# Patient Record
Sex: Female | Born: 1965 | Race: White | Hispanic: No | Marital: Single
Health system: Southern US, Community
[De-identification: ages and names within clinical notes are randomized; demographics above are authoritative.]

## PROBLEM LIST (undated history)

## (undated) HISTORY — PX: AUGMENTATION MAMMAPLASTY: SUR837

## (undated) HISTORY — PX: ABDOMINAL HYSTERECTOMY: SHX81

---

## 2004-11-11 ENCOUNTER — Ambulatory Visit: Payer: Self-pay | Admitting: Gastroenterology

## 2006-03-26 ENCOUNTER — Ambulatory Visit: Payer: Self-pay | Admitting: Gastroenterology

## 2006-04-02 ENCOUNTER — Ambulatory Visit: Payer: Self-pay | Admitting: Gastroenterology

## 2006-04-18 ENCOUNTER — Ambulatory Visit: Payer: Self-pay | Admitting: Gastroenterology

## 2007-09-02 ENCOUNTER — Ambulatory Visit: Payer: Self-pay | Admitting: Family Medicine

## 2008-11-03 ENCOUNTER — Ambulatory Visit: Payer: Self-pay | Admitting: Internal Medicine

## 2009-01-07 ENCOUNTER — Ambulatory Visit: Payer: Self-pay | Admitting: Nurse Practitioner

## 2009-04-07 ENCOUNTER — Ambulatory Visit: Payer: Self-pay | Admitting: Internal Medicine

## 2009-04-13 ENCOUNTER — Ambulatory Visit: Payer: Self-pay | Admitting: Gastroenterology

## 2009-04-21 ENCOUNTER — Ambulatory Visit: Payer: Self-pay | Admitting: Internal Medicine

## 2009-07-21 ENCOUNTER — Ambulatory Visit: Payer: Self-pay | Admitting: Internal Medicine

## 2009-08-11 ENCOUNTER — Ambulatory Visit: Payer: Self-pay | Admitting: Internal Medicine

## 2009-08-21 ENCOUNTER — Ambulatory Visit: Payer: Self-pay | Admitting: Internal Medicine

## 2009-12-04 ENCOUNTER — Ambulatory Visit: Payer: Self-pay | Admitting: Internal Medicine

## 2010-01-13 ENCOUNTER — Ambulatory Visit: Payer: Self-pay

## 2010-01-24 ENCOUNTER — Other Ambulatory Visit: Payer: Self-pay | Admitting: Physician Assistant

## 2010-02-02 ENCOUNTER — Ambulatory Visit: Payer: Self-pay | Admitting: Gastroenterology

## 2010-03-15 ENCOUNTER — Emergency Department: Payer: Self-pay | Admitting: Emergency Medicine

## 2010-05-01 ENCOUNTER — Ambulatory Visit: Payer: Self-pay | Admitting: Family Medicine

## 2011-03-30 ENCOUNTER — Ambulatory Visit: Payer: Self-pay

## 2012-01-09 ENCOUNTER — Ambulatory Visit: Payer: Self-pay | Admitting: Internal Medicine

## 2012-08-08 ENCOUNTER — Ambulatory Visit: Payer: Self-pay

## 2012-09-23 ENCOUNTER — Observation Stay: Payer: Self-pay | Admitting: Internal Medicine

## 2012-09-23 LAB — CBC
HCT: 40.1 % (ref 35.0–47.0)
MCHC: 34 g/dL (ref 32.0–36.0)
MCV: 94 fL (ref 80–100)
Platelet: 212 10*3/uL (ref 150–440)
RBC: 4.25 10*6/uL (ref 3.80–5.20)
WBC: 8.9 10*3/uL (ref 3.6–11.0)

## 2012-09-23 LAB — URINALYSIS, COMPLETE
Bilirubin,UR: NEGATIVE
Glucose,UR: NEGATIVE mg/dL (ref 0–75)
Hyaline Cast: 3
Leukocyte Esterase: NEGATIVE
Leukocyte Esterase: NEGATIVE
Nitrite: NEGATIVE
Ph: 6 (ref 4.5–8.0)
Ph: 5 (ref 4.5–8.0)
Protein: NEGATIVE
Protein: NEGATIVE
RBC,UR: 3 /HPF (ref 0–5)
RBC,UR: 1 /HPF (ref 0–5)
Specific Gravity: 1.025 (ref 1.003–1.030)

## 2012-09-23 LAB — COMPREHENSIVE METABOLIC PANEL
Albumin: 4.2 g/dL (ref 3.4–5.0)
BUN: 19 mg/dL — ABNORMAL HIGH (ref 7–18)
Co2: 25 mmol/L (ref 21–32)
Creatinine: 0.76 mg/dL (ref 0.60–1.30)
EGFR (African American): >60
Glucose: 86 mg/dL (ref 65–99)
Osmolality: 283 (ref 275–301)
SGOT(AST): 20 U/L (ref 15–37)
Sodium: 141 mmol/L (ref 136–145)

## 2012-09-23 LAB — LIPASE, BLOOD: Lipase: 125 U/L (ref 73–393)

## 2012-09-24 LAB — COMPREHENSIVE METABOLIC PANEL
Albumin: 3 g/dL — ABNORMAL LOW (ref 3.4–5.0)
Anion Gap: 9 (ref 7–16)
BUN: 14 mg/dL (ref 7–18)
Calcium, Total: 7.3 mg/dL — ABNORMAL LOW (ref 8.5–10.1)
Co2: 21 mmol/L (ref 21–32)
Creatinine: 0.65 mg/dL (ref 0.60–1.30)
EGFR (African American): >60
EGFR (Non-African Amer.): >60
SGOT(AST): 13 U/L — ABNORMAL LOW (ref 15–37)
Sodium: 146 mmol/L — ABNORMAL HIGH (ref 136–145)
Total Protein: 5.5 g/dL — ABNORMAL LOW (ref 6.4–8.2)

## 2012-09-24 LAB — CBC WITH DIFFERENTIAL/PLATELET
Basophil #: 0 10*3/uL (ref 0.0–0.1)
Basophil %: 0.4 %
Eosinophil #: 0 10*3/uL (ref 0.0–0.7)
Lymphocyte #: 2.6 10*3/uL (ref 1.0–3.6)
Lymphocyte %: 32.5 %
MCHC: 33.4 g/dL (ref 32.0–36.0)
MCV: 94 fL (ref 80–100)
Monocyte #: 0.7 x10 3/mm (ref 0.2–0.9)
Monocyte %: 8 %
Neutrophil #: 4.8 10*3/uL (ref 1.4–6.5)
Platelet: 151 10*3/uL (ref 150–440)
RBC: 3.31 10*6/uL — ABNORMAL LOW (ref 3.80–5.20)
WBC: 8.1 10*3/uL (ref 3.6–11.0)

## 2012-09-25 LAB — BASIC METABOLIC PANEL
BUN: 5 mg/dL — ABNORMAL LOW (ref 7–18)
Calcium, Total: 7.1 mg/dL — ABNORMAL LOW (ref 8.5–10.1)
Chloride: 123 mmol/L — ABNORMAL HIGH (ref 98–107)
Co2: 21 mmol/L (ref 21–32)
Creatinine: 0.55 mg/dL — ABNORMAL LOW (ref 0.60–1.30)
EGFR (Non-African Amer.): >60
Glucose: 93 mg/dL (ref 65–99)
Osmolality: 295 (ref 275–301)
Potassium: 3.4 mmol/L — ABNORMAL LOW (ref 3.5–5.1)
Sodium: 150 mmol/L — ABNORMAL HIGH (ref 136–145)

## 2012-09-25 LAB — CBC WITH DIFFERENTIAL/PLATELET
Basophil %: 0.8 %
Eosinophil #: 0.1 10*3/uL (ref 0.0–0.7)
HCT: 33.5 % — ABNORMAL LOW (ref 35.0–47.0)
HGB: 10.9 g/dL — ABNORMAL LOW (ref 12.0–16.0)
Lymphocyte #: 2.1 10*3/uL (ref 1.0–3.6)
Lymphocyte %: 39.9 %
MCH: 31.1 pg (ref 26.0–34.0)
MCHC: 32.6 g/dL (ref 32.0–36.0)
Monocyte #: 0.5 x10 3/mm (ref 0.2–0.9)
Monocyte %: 8.9 %
Neutrophil #: 2.6 10*3/uL (ref 1.4–6.5)
Neutrophil %: 49.2 %
Platelet: 162 10*3/uL (ref 150–440)
RBC: 3.51 10*6/uL — ABNORMAL LOW (ref 3.80–5.20)
RDW: 12.3 % (ref 11.5–14.5)
WBC: 5.4 10*3/uL (ref 3.6–11.0)

## 2012-09-25 LAB — URINE CULTURE

## 2012-09-26 LAB — CBC WITH DIFFERENTIAL/PLATELET
Basophil #: 0.1 10*3/uL (ref 0.0–0.1)
Basophil %: 0.7 %
Eosinophil %: 0.7 %
HCT: 33.4 % — ABNORMAL LOW (ref 35.0–47.0)
Lymphocyte #: 2.8 10*3/uL (ref 1.0–3.6)
Monocyte #: 0.5 x10 3/mm (ref 0.2–0.9)
Neutrophil #: 4.1 10*3/uL (ref 1.4–6.5)
Neutrophil %: 55.1 %
Platelet: 167 10*3/uL (ref 150–440)
WBC: 7.4 10*3/uL (ref 3.6–11.0)

## 2012-09-27 LAB — PATHOLOGY REPORT

## 2012-10-24 ENCOUNTER — Emergency Department: Payer: Self-pay | Admitting: Internal Medicine

## 2012-10-24 LAB — URINALYSIS, COMPLETE
Bilirubin,UR: NEGATIVE
Glucose,UR: NEGATIVE mg/dL (ref 0–75)
Leukocyte Esterase: NEGATIVE
Ph: 5 (ref 4.5–8.0)
Protein: NEGATIVE
RBC,UR: 6 /HPF (ref 0–5)
Specific Gravity: 1.016 (ref 1.003–1.030)
WBC UR: 1 /HPF (ref 0–5)

## 2012-10-24 LAB — COMPREHENSIVE METABOLIC PANEL
Albumin: 4.3 g/dL (ref 3.4–5.0)
Alkaline Phosphatase: 72 U/L (ref 50–136)
BUN: 9 mg/dL (ref 7–18)
Bilirubin,Total: 0.2 mg/dL (ref 0.2–1.0)
Chloride: 112 mmol/L — ABNORMAL HIGH (ref 98–107)
Co2: 24 mmol/L (ref 21–32)
Creatinine: 0.72 mg/dL (ref 0.60–1.30)
EGFR (African American): >60
EGFR (Non-African Amer.): >60
Glucose: 105 mg/dL — ABNORMAL HIGH (ref 65–99)
Potassium: 3.8 mmol/L (ref 3.5–5.1)
SGOT(AST): 16 U/L (ref 15–37)
SGPT (ALT): 14 U/L (ref 12–78)
Sodium: 143 mmol/L (ref 136–145)

## 2012-10-24 LAB — TSH: Thyroid Stimulating Horm: 0.75 u[IU]/mL

## 2012-10-24 LAB — DRUG SCREEN, URINE
Barbiturates, Ur Screen: NEGATIVE (ref ?–200)
Benzodiazepine, Ur Scrn: POSITIVE (ref ?–200)
Cannabinoid 50 Ng, Ur ~~LOC~~: NEGATIVE (ref ?–50)
Cocaine Metabolite,Ur ~~LOC~~: NEGATIVE (ref ?–300)
Tricyclic, Ur Screen: NEGATIVE (ref ?–1000)

## 2012-10-24 LAB — LIPASE, BLOOD: Lipase: 89 U/L (ref 73–393)

## 2012-10-24 LAB — HEPATIC FUNCTION PANEL A (ARMC): Bilirubin, Direct: <0.05 mg/dL (ref 0.00–0.20)

## 2013-05-29 ENCOUNTER — Emergency Department: Payer: Self-pay | Admitting: Emergency Medicine

## 2013-05-29 LAB — COMPREHENSIVE METABOLIC PANEL
Alkaline Phosphatase: 82 U/L (ref 50–136)
Anion Gap: 7 (ref 7–16)
BUN: 18 mg/dL (ref 7–18)
Calcium, Total: 9.7 mg/dL (ref 8.5–10.1)
Chloride: 109 mmol/L — ABNORMAL HIGH (ref 98–107)
Co2: 24 mmol/L (ref 21–32)
Creatinine: 0.78 mg/dL (ref 0.60–1.30)
EGFR (Non-African Amer.): >60
Glucose: 102 mg/dL — ABNORMAL HIGH (ref 65–99)
Osmolality: 281 (ref 275–301)
Potassium: 3.8 mmol/L (ref 3.5–5.1)
SGPT (ALT): 16 U/L (ref 12–78)
Sodium: 140 mmol/L (ref 136–145)
Total Protein: 7.1 g/dL (ref 6.4–8.2)

## 2013-05-29 LAB — LIPASE, BLOOD: Lipase: 123 U/L (ref 73–393)

## 2013-05-29 LAB — CBC
HCT: 37.3 % (ref 35.0–47.0)
HGB: 13.2 g/dL (ref 12.0–16.0)
MCHC: 35.3 g/dL (ref 32.0–36.0)
MCV: 91 fL (ref 80–100)
RBC: 4.11 10*6/uL (ref 3.80–5.20)

## 2014-12-11 NOTE — Consult Note (Signed)
Chief Complaint:   Subjective/Chief Complaint Patient with c/o intense uper epigstric pain after egd.  EGD uneventful.  Patient states pain is similar to that she gets occasionally.  Will give scheduled meds and obtain 3 way abd films.   Electronic Signatures: Barnetta ChapelSkulskie, Martin (MD)  (Signed 05-Feb-14 15:59)  Authored: Chief Complaint   Last Updated: 05-Feb-14 15:59 by Barnetta ChapelSkulskie, Martin (MD)

## 2014-12-11 NOTE — Consult Note (Signed)
Chief Complaint:   Subjective/Chief Complaint seen for n/v abdominalpain.  Mild nausea today, much less abdominalpain, no emesis.   VITAL SIGNS/ANCILLARY NOTES: **Vital Signs.:   05-Feb-14 06:20   Vital Signs Type Routine   Temperature Temperature (F) 98.1   Celsius 36.7   Temperature Source Oral   Pulse Pulse 93   Respirations Respirations 18   Systolic BP Systolic BP 111   Diastolic BP (mmHg) Diastolic BP (mmHg) 76   Mean BP 87   Pulse Ox % Pulse Ox % 100   Pulse Ox Activity Level  At rest   Oxygen Delivery Room Air/ 21 %   Brief Assessment:   Cardiac Regular    Respiratory clear BS    Gastrointestinal details normal Soft  Nondistended  No masses palpable  Bowel sounds normal  mild epigastric discomfort to palpation   Lab Results: Routine Chem:  05-Feb-14 06:26    Glucose, Serum 93   BUN  5   Creatinine (comp)  0.55   Sodium, Serum  150   Potassium, Serum  3.4   Chloride, Serum  123   CO2, Serum 21   Calcium (Total), Serum  7.1   Anion Gap  6   Osmolality (calc) 295   eGFR (African American) >60   eGFR (Non-African American) >60 (eGFR values <24mL/min/1.73 m2 may be an indication of chronic kidney disease (CKD). Calculated eGFR is useful in patients with stable renal function. The eGFR calculation will not be reliable in acutely ill patients when serum creatinine is changing rapidly. It is not useful in  patients on dialysis. The eGFR calculation may not be applicable to patients at the low and high extremes of body sizes, pregnant women, and vegetarians.)   Magnesium, Serum 2.0 (1.8-2.4 THERAPEUTIC RANGE: 4-7 mg/dL TOXIC: > 10 mg/dL  -----------------------)  Routine Hem:  05-Feb-14 06:26    WBC (CBC) 5.4   RBC (CBC)  3.51   Hemoglobin (CBC)  10.9   Hematocrit (CBC)  33.5   Platelet Count (CBC) 162   MCV 96   MCH 31.1   MCHC 32.6   RDW 12.3   Neutrophil % 49.2   Lymphocyte % 39.9   Monocyte % 8.9   Eosinophil % 1.2   Basophil % 0.8   Neutrophil  # 2.6   Lymphocyte # 2.1   Monocyte # 0.5   Eosinophil # 0.1   Basophil # 0.0 (Result(s) reported on 25 Sep 2012 at 07:03AM.)   Assessment/Plan:  Assessment/Plan:   Assessment 1) n.v abdominal pain in the setting of h/o recurrent erosive gastritis.  improved symptoms on iv ppi.    Plan 1) egd today.  I have discussed the risks benefits and complicatonsof egd to include not limited to bleeding infectionp perforation and sedation and she wishes to proceed.   Electronic Signatures: Loistine Simas (MD)  (Signed 05-Feb-14 13:24)  Authored: Chief Complaint, VITAL SIGNS/ANCILLARY NOTES, Brief Assessment, Lab Results, Assessment/Plan   Last Updated: 05-Feb-14 13:24 by Loistine Simas (MD)
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
## 2014-12-11 NOTE — H&P (Signed)
PATIENT NAME:  Alexandra Lucero, Alexandra Lucero MR#:  [id number] DATE OF BIRTH:  05/03/1966  DATE OF ADMISSION:  09/23/2012  PRIMARY CARE PHYSICIAN: Nonlocal  REFERRING PHYSICIAN: Dr. Janalyn Harder   CHIEF COMPLAINT: Epigastric pain, nausea and vomiting.   HISTORY OF PRESENT ILLNESS: The patient is a very nice 49 year old female who has a history of migraines, peptic ulcer disease and GERD. The patient has been in her normal stage of health but today comes with a history of severe epigastric pain, nausea and vomiting since 3:00 a.m. last night. The pain started on the epigastric area, woke her up in the middle of the morning at 3:00 a.m. Apparently, she usually has episodes like this occasionally a couple of times a month, occasionally weekly, but they relieve after she takes Phenergan. The pain just got worse this morning for which she asked her boyfriend to bring her here to the ER. Apparently, she has vomited about 30 times, and the vomit has become large and right now fecaloid in characteristics. The patient is having a lot of dry heaves. There is no blood in the vomit, just like mucus at the beginning, now it is brown fecaloid-looking. She has not had any diarrhea. She has not had any constipation although she does not go to the bathroom every day. She has normal bowel movements every couple of days, usually.  She does not eat  much. She is very thin.  She has a very high-stress job. She works 6 days a week and doesn't get the right schedules. She has not eaten anything since 5:00 p.m. yesterday, and her pain has improved. She was given a large amount of medications here including diazepam, hydromorphone, metoclopramide, morphine, Zofran, Compazine, promethazine. Nothing has helped the pain, for which we are going to admit. A GI consultation is granted. I am going to obtain a stat KUB because of the change of the vomit to be fecaloid. If there are any changes on the KUB, we are going to consult Surgery.  For now, I  am planning on consulting GI since they have seen her in the past.   REVIEW OF SYSTEMS:   CONSTITUTIONAL: Negative fever, negative fatigue, negative weakness, negative significant weight gain or weight loss. EYES: No blurry vision, double vision. She occasionally has migraines, and she has blurry vision with them; but she has not had a migraine in a while. She has had several headaches in the last couple of days.  ENT: No tinnitus. No ear pain. No postnasal drip. No sinus pain.   RESPIRATORY: No cough. No wheezing. No hemoptysis. No painful respiration. No history of pneumonia.  CARDIOVASCULAR: No chest pain, orthopnea, edema, arrhythmias, palpitations, or syncope.  GASTROINTESTINAL: Positive nausea, positive vomiting, but no diarrhea. Positive epigastric pain as mentioned above. No hematemesis. No melena. No jaundice. No rectal bleeding. No hemorrhoids. No change in bowel habits.  GENITOURINARY: No dysuria or hematuria. No change of frequency or incontinence. No genitourinary problems.  She had a hysterectomy in the past. She has never been pregnant.  ENDOCRINOLOGY: No polyuria, polydipsia, or polyphagia. No cold or heat intolerance.  HEMATOLOGIC/LYMPHATIC: No history of bleeding, easy bruising or swollen glands.  SKIN: No rashes or skin lesions.  MUSCULOSKELETAL: No swelling of joints, no back pain, shoulder pain, knee or hip pain.  NEUROLOGICAL: No numbness, tingling. No ataxia. No CVA. No TIA. No changes in bowel incontinence or urinary incontinence.  PSYCHIATRIC: No anxiety or significant depression.   PAST MEDICAL HISTORY: Positive for migraines. The patient  states she has history of  peptic ulcer disease. She had an upper GI done in 2011 by Dr. Niel HummerIftikhar which showed normal esophagus, nonbleeding erosive gastropathy, normal duodenum. No signs of ulcers that I can tell. GERD.   ALLERGIES: No known drug allergies.   PAST SURGICAL HISTORY: History of bilateral breast implants and a  hysterectomy.   FAMILY HISTORY: Negative for MI or coronary artery disease.  Her dad has diabetes. Her mother had has been healthy. Her brother has Crohn's, and her niece has Crohn's as well.   SOCIAL HISTORY: The patient is a smoker. He smokes a little bit less than a pack a day. She has been smoking for over 20 years. She does not drink. She drinks one cup of coffee in the morning and a lot of Coca-Cola during the day to keep herself awake. She denies any IV drug abuse. 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 of the morning to noon, an hour break, and then from 1 until 7. She has a very high stress job, apparently.   MEDICATIONS: The patient takes promethazine oral syrup, oxycodone 10 mg every 6 hours, Estrace vaginal cream 3 times a day, diazepam 10 mg once a day, Dexilant 60 mg orally once daily.   PHYSICAL EXAMINATION: VITAL SIGNS: Blood pressure 114/72, pulse 75, respiratory rate 20, temperature 98.0, oxygen saturation 99% on room air.  GENERAL: The patient is alert at this moment. She was sleeping but easy to be aroused. She is communicative and  cooperative.  HEENT: Her pupils are equal and reactive. Extraocular movements are intact. Mucosa are overall moist. No oropharyngeal exudates. No oral lesions. Anicteric sclerae. Pink conjunctivae.  NECK: Supple. No JVD. No thyromegaly. No adenopathy. No carotid bruits. No masses. No rigidity.  CARDIOVASCULAR: Regular rate and rhythm. No murmurs, rubs, or gallops are appreciated. No tenderness to palpation to anterior chest. No displacement of PMI.  LUNGS: Clear without any wheezing or crepitus. No use of accessory muscles.  ABDOMEN: Slightly tender on the epigastric area. No rebound. No guarding. Bowel sounds are decreased. McBurney's negative. Murphy's negative.  GENITAL: Negative for external lesions.  EXTREMITIES: No edema, no cyanosis, no clubbing. Pulses +2. Capillary refill is around 3  seconds. No rashes or petechiae on the skin.  NEUROLOGIC: Cranial nerves II through XII are intact.   PSYCHIATRIC: Mood: Very flat affect. The patient is withdrawn due to her vomiting and a dark room. She is cooperative.  MUSCULOSKELETAL: No significant joint effusions or joint abnormalities.  LYMPHATIC: No lymph nodes on neck or supraclavicular areas.   LABORATORY, DIAGNOSTIC AND RADIOLOGICAL DATA:  Glucose 86, BUN 19, creatinine 0.76, sodium 141, potassium 4.1, chloride 110. Hepatic panel was negative. UA: 1 white blood cell, 3 red blood cells, trace ketones. Hemoglobin is 13.6, white count is 8.9, hematocrit 40, platelets 212.   CT scan of the abdomen with IV contrast shows no acute abdominal pathology. No gross abnormality of the intestines, unremarkable gallbladder.  Kidneys are normal. Ultrasound of the abdomen, limited survey: Normal right upper quadrant.   ASSESSMENT AND PLAN: A 49 year old female with history of migraines, previous history of severe gastritis and GERD. She states that she has peptic  ulcer disease although her last EGD did not show any ulcers, just nonerosive gastritis, and it was done by Dr. Niel HummerIftikhar.   1. Intractable nausea and vomiting: The patient had a significant amount of vomiting over 30 times.  Here at the ER, it has been witnessed for many, many times. Her characteristics of the vomit have changed, actually, from being clear to now being fecaloid. There is no blood, and there has not been melena. She has not had a bowel movement in a couple of days, but that is her normal bowel habits I am going to get a stat KUB since her pattern for the bowel movement has changed, now looks fecaloid, just to evaluate for obstruction. If worsening, we are going to put in an NG tube. I am going to write on for Phenergan, Zofran, scopolamine and Compazine. I am going to keep her on IV fluids to keep her well hydrated at 125 an hour, monitor her electrolytes. At this moment, the patient  is going to be admitted for intractable vomiting and intractable pain. Continue Dilaudid as needed. I am going to put her on a PPI every 12 hours, and I am going to obtain a GI consultation. At this moment, have don't have a clear idea why she is having this much vomiting, although this seems to be a problem that has been chronic, not as severe as it is today, but she always has some epigastric pain, nausea and vomiting almost on a weekly basis, and it has to be related to her lifestyle, high-stress job. The possibility of the patient having ulcer right now is high. I am going to ask Dr. Niel Hummer to see if he wants to do an EGD.  I am going to order Helicobacter pylori breath test, if possible.  2. Migraines: The patient has significant migraines. At this moment they are controlled.  3. Gastroesophageal reflux disease: Continue PPI.  4. Tobacco abuse: The patient is a current smoker. We are giving her counseling at this moment, and I asked her to quit smoking, spent 3 minutes on smoking counseling.   CODE STATUS: The patient is a FULL CODE.    TIME SPENT: I spent about 50 minutes with this admission.   ____________________________ Felipa Furnace, MD rsg:cb D: 09/23/2012 14:00:36 ET T: 09/23/2012 15:51:56 ET JOB#: [id number]  cc: Felipa Furnace, MD, <Dictator> Lurline Del, MD Pearletha Furl MD ELECTRONICALLY SIGNED 10/01/2012 13:46

## 2014-12-11 NOTE — Discharge Summary (Signed)
PATIENT NAME:  Alexandra Lucero, Alexandra Lucero MR#:  [id number] DATE OF BIRTH:  06-Jan-1966  DATE OF ADMISSION:  09/23/2012 DATE OF DISCHARGE:  09/26/2012  PRIMARY CARE PHYSICIAN: None local.  CONSULTING PHYSICIAN: Barnetta ChapelMartin Skulskie, MD.  PROCEDURE: EGD.   DISCHARGE DIAGNOSES: 1. Bile gastritis. 2. Nonbleeding erosive gastropathy.  3. Anemia.  4. Gastroesophageal reflux disease. 5. Hypokalemia.   CODE STATUS: FULL CODE.   CONDITION: Stable.   HOME MEDICATIONS: 1. Promethazine 6.25 mg/5 mL oral syrup 20 mL twice a day p.r.n. for nausea.  2. Diazepam 10 mg p.o. at bedtime.  3. Estrace vaginal 0.1 mg/gram vaginal cream one application 3 times a week.  4. Dexilant 60 mg p.o. delayed capsule one cap a day.  5. Protonix 40 mg p.o. daily for 21 days.  6. Carafate 1 gram 4 times a day before meals and at bedtime.   DIET: Regular diet.   ACTIVITY: As tolerated.   FOLLOWUP CARE: Follow up with Dr. Marva PandaSkulskie within 3 weeks. Follow up with PCP within 1 to 2 weeks.   REASON FOR ADMISSION: Epigastric pain, nausea and vomiting.   HOSPITAL COURSE: The patient is a 49 year old Caucasian female with a history of migraines, peptic ulcer disease and GERD who presented to the ED with epigastric pain and intractable nausea and vomiting. For detailed history and physical examination, please refer to the admission note dictated by Dr. Mordecai MaesSanchez. The patient had a CAT scan of abdomen with IV contrast that showed no acute abdominal pathology and kidneys are normal. Ultrasound of abdomen is normal. The patient has been treated with Zofran and morphine for nausea and vomiting and abdominal pain. She has been treated with normal saline IV support. The patient's potassium and magnesium were low. She was treated with supplement. Dr. Marva PandaSkulskie evaluated the patient and did endoscopy yesterday which showed bile gastritis and nonbleeding gastropathy. He suggested the patient get Protonix for 3 weeks and Carafate for 1 month. The  patient has no symptoms, no complaints today. The patient is discharged to home.  I discussed the patient's discharge plan with the patient, Dr. Marva PandaSkulskie, and the case manager.   TIME SPENT: About 36 minutes.  ____________________________ Shaune PollackQing Demiyah Fischbach, MD qc:sb D: 09/26/2012 18:07:51 ET T: 09/27/2012 07:18:00 ET JOB#: [id number]  cc: Shaune PollackQing Mayline Dragon, MD, <Dictator> Shaune PollackQING Sanae Willetts MD ELECTRONICALLY SIGNED 09/27/2012 17:50

## 2014-12-16 ENCOUNTER — Other Ambulatory Visit: Payer: Self-pay

## 2014-12-16 DIAGNOSIS — Z1231 Encounter for screening mammogram for malignant neoplasm of breast: Secondary | ICD-10-CM

## 2015-01-04 ENCOUNTER — Ambulatory Visit: Payer: Self-pay

## 2015-09-08 IMAGING — CT CT ABD-PELV W/ CM
1 of 2 series · 15 of 32 positions shown, 19 images · non-contrast
Comparison: none

REASON FOR EXAM: (1) pain and multiple emesis; (2) pain
COMMENTS:

PROCEDURE:     CT  - CT ABDOMEN / PELVIS  W  - May 29, 2013  [DATE]
RESULT:     CT abdomen pelvis dated 05/29/2013
TECHNIQUE: Helical 3 mm sections were obtained from the lung bases through
the pubic symphysis status post intravenous administration of 75 mL of
Dsovue-000.

[Series 2: 3mm soft tissue · axial · 0.62mm/px · z∈[-817,-424]mm · 15 of 143 slices shown, 19 images]
[im 6/143  soft-tissue]
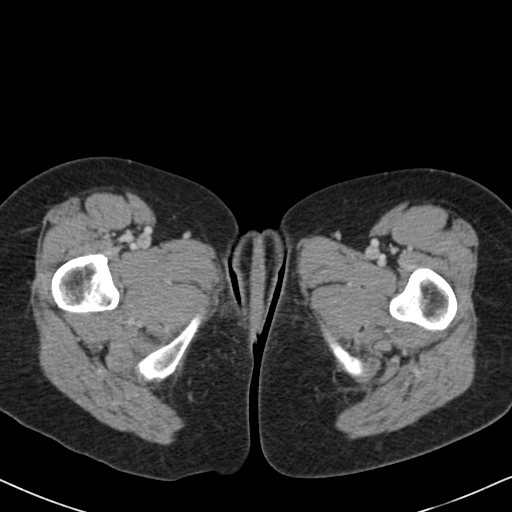
[im 6/143  bone]
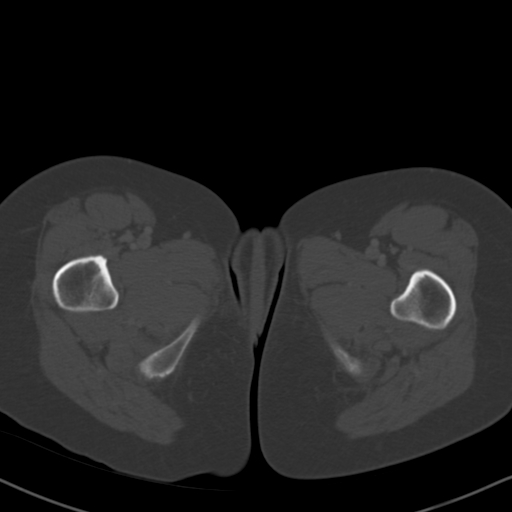
[im 18/143  soft-tissue]
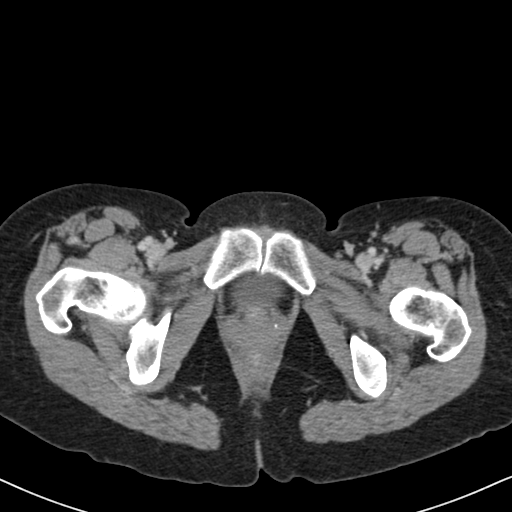
[im 29/143  soft-tissue]
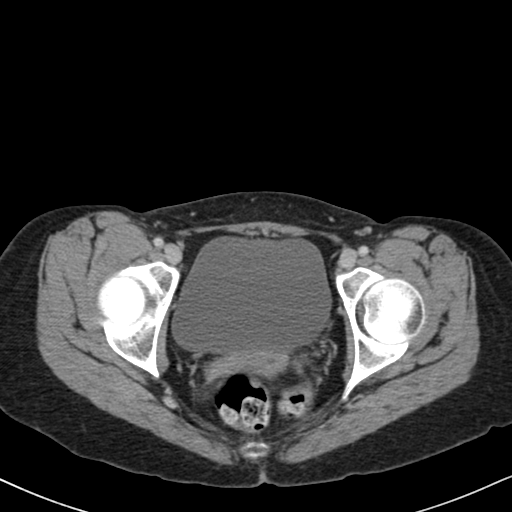
[im 40/143  soft-tissue]
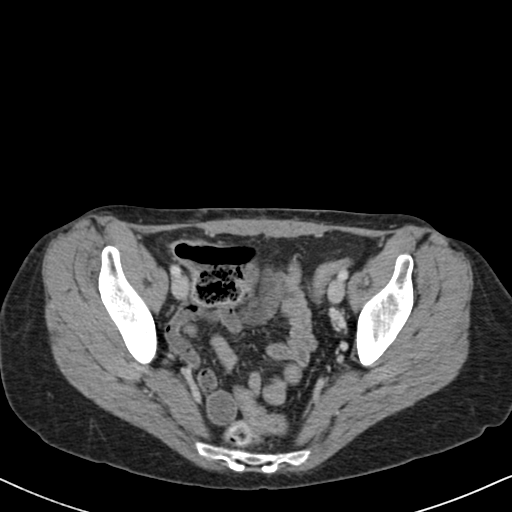
[im 52/143  soft-tissue]
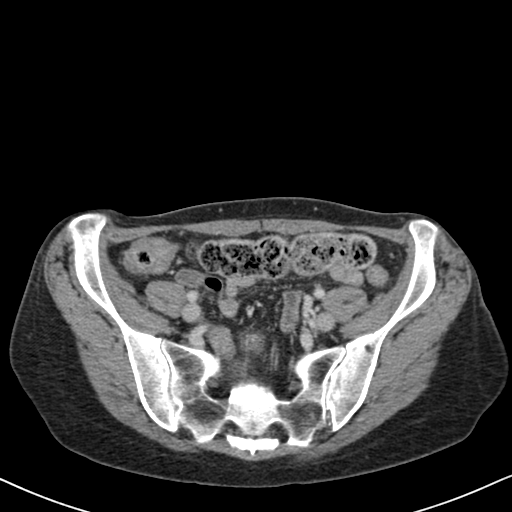
[im 63/143  soft-tissue]
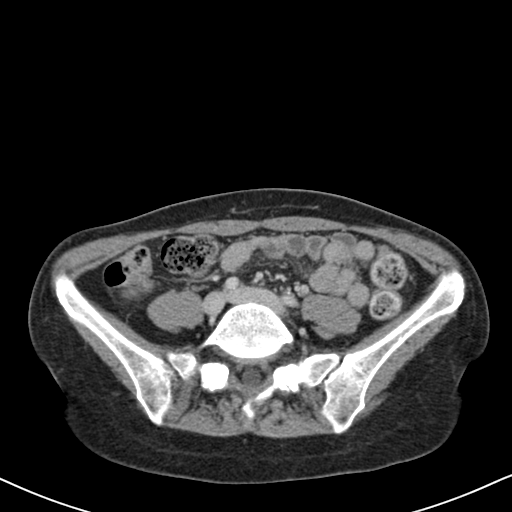
[im 74/143  soft-tissue]
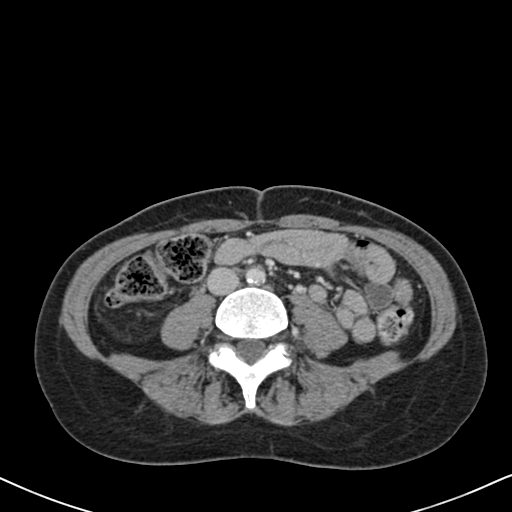
[im 80/143  soft-tissue]
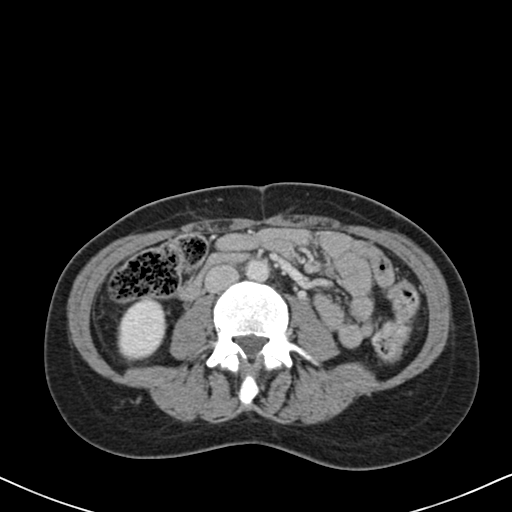
[im 91/143  soft-tissue]
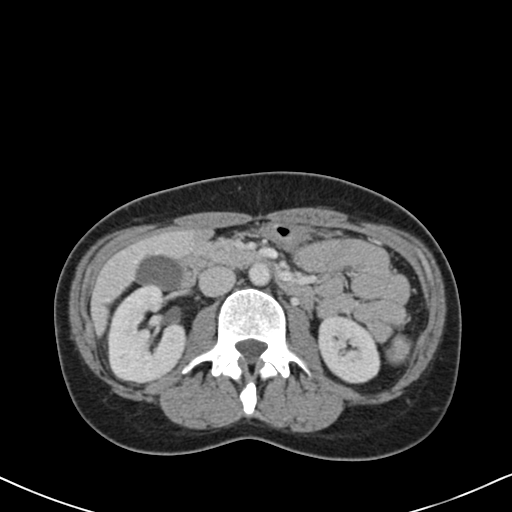
[im 91/143  bone]
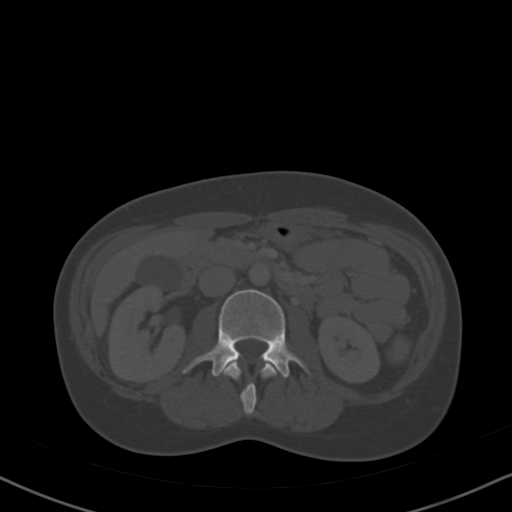
[im 103/143  soft-tissue]
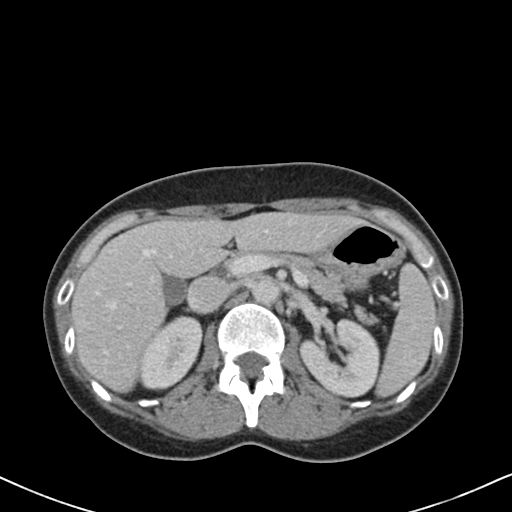
[im 114/143  soft-tissue]
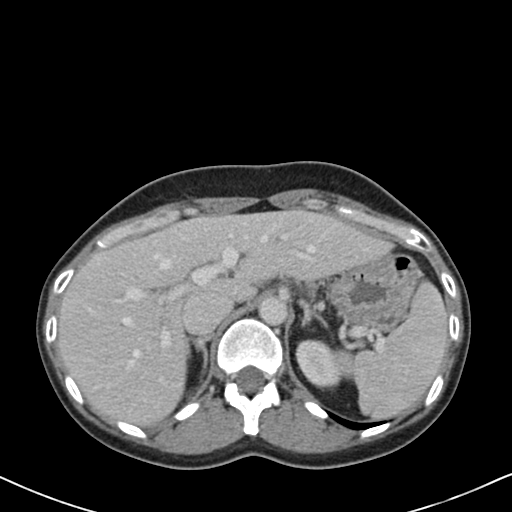
[im 120/143  lung]
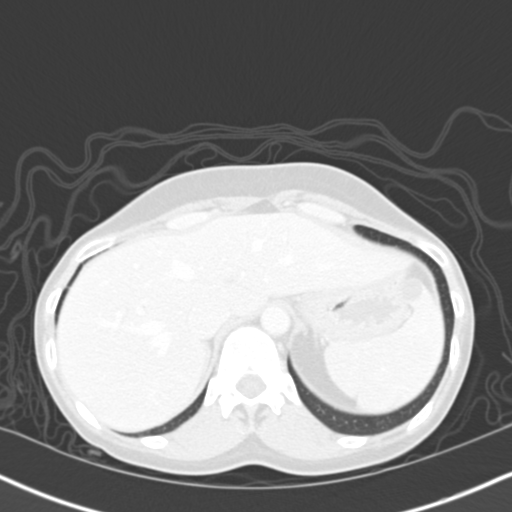
[im 125/143  soft-tissue]
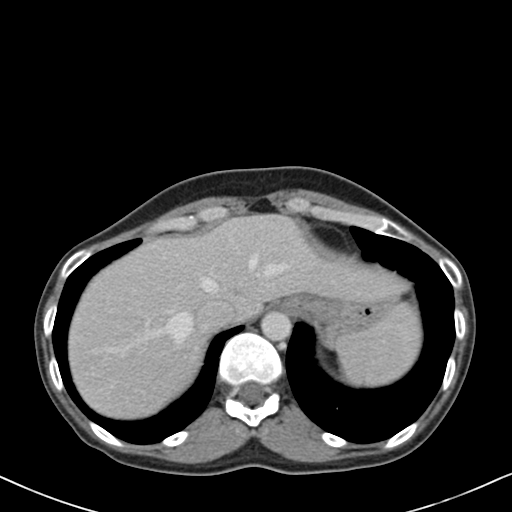
[im 125/143  lung]
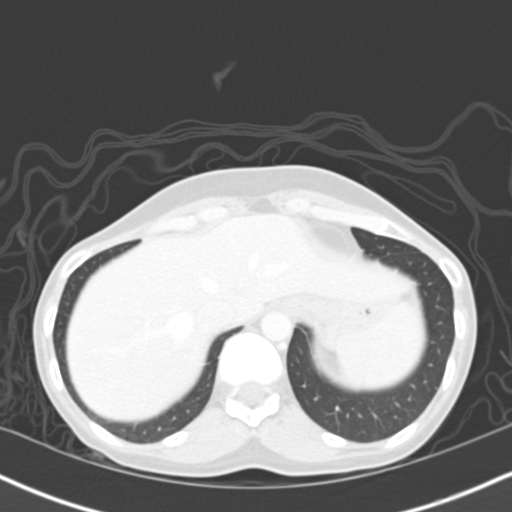
[im 131/143  lung]
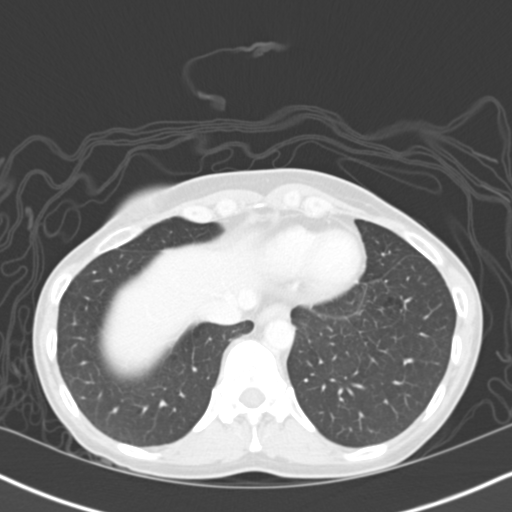
[im 137/143  soft-tissue]
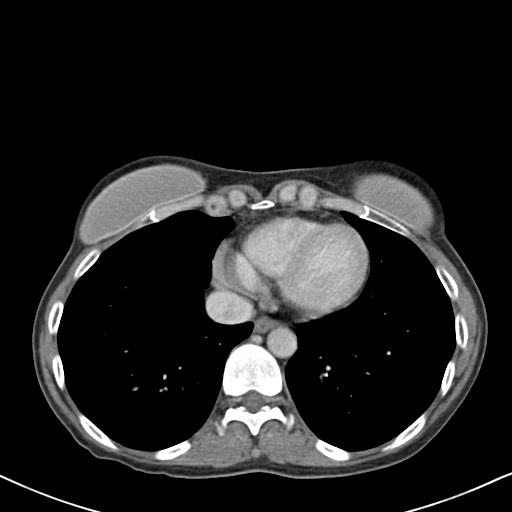
[im 137/143  lung]
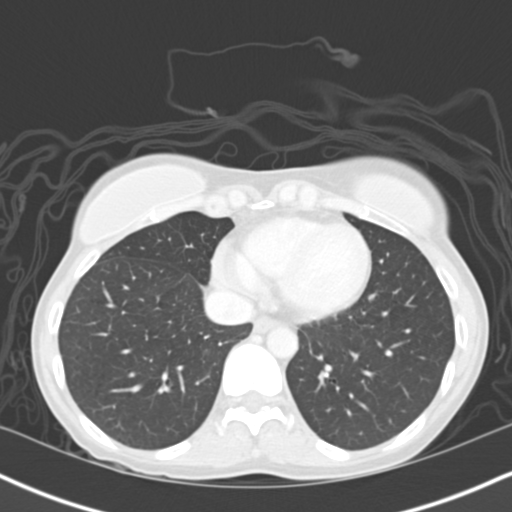

[15 of 32 positions shown; findings below may reference images not displayed]

FINDINGS: The lung bases are unremarkable. The  spleen, adrenals, pancreas,
kidneys are unremarkable. The liver demonstrates a small 8 to 9 mm low
attenuating nodule with a peripheral nodular border in the dome of the right
lobe of the liver. Compared to previous study dated 09/23/2012 this finding is
unchanged like represents a small hemangioma. A small low attenuating focus
projects along the anterior tip of the right lobe of the liver. This finding
is an area of focal fatty infiltration also stable from the previous study.

There is no evidence of bowel obstruction, enteritis, colitis, nor
diverticulitis. The patient status post appendectomy. Moderate amount of
fecal retention is appreciated within the colon. There is no evidence of an
abdominal aortic aneurysm. There is no evidence of masses, nor adenopathy.

Celiac, SMA, IMA, portal vein, SMV are opacified.
IMPRESSION: No CT evidence of obstructive or inflammatory abnormalities.
2. Findings within the liver as described above medical correlation
recommended if clinically warranted.
3. Moderate amount of fecal retention within the colon.

## 2018-06-06 ENCOUNTER — Other Ambulatory Visit: Payer: Self-pay | Admitting: Family

## 2018-06-06 DIAGNOSIS — Z1231 Encounter for screening mammogram for malignant neoplasm of breast: Secondary | ICD-10-CM

## 2020-03-12 LAB — COLOGUARD: COLOGUARD: NEGATIVE

## 2021-06-09 ENCOUNTER — Other Ambulatory Visit: Payer: Self-pay | Admitting: Family

## 2021-06-09 DIAGNOSIS — Z1231 Encounter for screening mammogram for malignant neoplasm of breast: Secondary | ICD-10-CM

## 2021-07-05 ENCOUNTER — Other Ambulatory Visit: Payer: Self-pay | Admitting: Family

## 2021-07-05 ENCOUNTER — Ambulatory Visit
Admission: RE | Admit: 2021-07-05 | Discharge: 2021-07-05 | Disposition: A | Discharge status: Home / Self Care | Payer: BLUE CROSS/BLUE SHIELD | Source: Ambulatory Visit | Attending: Family | Admitting: Family

## 2021-07-05 ENCOUNTER — Other Ambulatory Visit: Payer: Self-pay

## 2021-07-05 DIAGNOSIS — Z1231 Encounter for screening mammogram for malignant neoplasm of breast: Secondary | ICD-10-CM

## 2023-01-18 ENCOUNTER — Ambulatory Visit: Payer: BLUE CROSS/BLUE SHIELD | Admitting: Gastroenterology

## 2023-11-21 ENCOUNTER — Other Ambulatory Visit: Payer: Self-pay | Admitting: Medical Genetics

## 2024-06-19 ENCOUNTER — Other Ambulatory Visit: Payer: Self-pay | Admitting: Medical Genetics

## 2024-06-19 DIAGNOSIS — Z006 Encounter for examination for normal comparison and control in clinical research program: Secondary | ICD-10-CM
